# Patient Record
Sex: Female | Born: 2000 | Hispanic: No | Marital: Single
Health system: Southern US, Community
[De-identification: ages and names within clinical notes are randomized; demographics above are authoritative.]

---
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
## 2021-04-26 ENCOUNTER — Encounter (HOSPITAL_COMMUNITY): Payer: Self-pay | Admitting: Pharmacy Technician

## 2021-04-26 ENCOUNTER — Emergency Department (HOSPITAL_COMMUNITY): Payer: Managed Care, Other (non HMO)

## 2021-04-26 ENCOUNTER — Emergency Department (HOSPITAL_COMMUNITY)
Admission: EM | Admit: 2021-04-26 | Discharge: 2021-04-27 | Disposition: A | Discharge status: Home / Self Care | Payer: Managed Care, Other (non HMO) | Attending: Emergency Medicine | Admitting: Emergency Medicine

## 2021-04-26 DIAGNOSIS — M545 Low back pain, unspecified: Secondary | ICD-10-CM | POA: Insufficient documentation

## 2021-04-26 DIAGNOSIS — N3001 Acute cystitis with hematuria: Secondary | ICD-10-CM | POA: Insufficient documentation

## 2021-04-26 DIAGNOSIS — Y92410 Unspecified street and highway as the place of occurrence of the external cause: Secondary | ICD-10-CM | POA: Insufficient documentation

## 2021-04-26 DIAGNOSIS — M62830 Muscle spasm of back: Secondary | ICD-10-CM | POA: Diagnosis not present

## 2021-04-26 LAB — URINALYSIS, ROUTINE W REFLEX MICROSCOPIC
Bilirubin Urine: NEGATIVE
Glucose, UA: NEGATIVE mg/dL
Ketones, ur: 80 mg/dL — AB
Nitrite: POSITIVE — AB
Protein, ur: 30 mg/dL — AB
RBC / HPF: >50 RBC/hpf — ABNORMAL HIGH (ref 0–5)
Specific Gravity, Urine: 1.029 (ref 1.005–1.030)
pH: 5 (ref 5.0–8.0)

## 2021-04-26 LAB — POC URINE PREG, ED: Preg Test, Ur: NEGATIVE

## 2021-04-26 MED ORDER — ACETAMINOPHEN 500 MG PO TABS
1000.0000 mg | ORAL_TABLET | Freq: Once | ORAL | Status: AC
  Administered 2021-04-26: 1000 mg via ORAL
  Filled 2021-04-26: qty 2

## 2021-04-26 MED ORDER — KETOROLAC TROMETHAMINE 15 MG/ML IJ SOLN
15.0000 mg | Freq: Once | INTRAMUSCULAR | Status: AC
  Administered 2021-04-26: 15 mg via INTRAMUSCULAR
  Filled 2021-04-26: qty 1

## 2021-04-26 MED ORDER — CEPHALEXIN 250 MG PO CAPS: 250.0000 mg | ORAL_CAPSULE | Freq: Four times a day (QID) | ORAL | 0 refills | Status: AC

## 2021-04-26 MED ORDER — CYCLOBENZAPRINE HCL 10 MG PO TABS
5.0000 mg | ORAL_TABLET | Freq: Once | ORAL | Status: AC
  Administered 2021-04-26: 5 mg via ORAL
  Filled 2021-04-26: qty 1

## 2021-04-26 MED ORDER — LIDOCAINE 5 % EX PTCH
1.0000 | MEDICATED_PATCH | CUTANEOUS | Status: DC
  Administered 2021-04-26: 1 via TRANSDERMAL
  Filled 2021-04-26: qty 1
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
## 2021-04-26 NOTE — Discharge Instructions (Addendum)
For muscle pains and aches I recommend that you take Tylenol and Motrin as needed for the next 3-5 days.  You do have a urinary tract infection.  I have prescribed you an antibiotic.  Please take it as directed.
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
## 2022-03-09 ENCOUNTER — Other Ambulatory Visit: Payer: Self-pay

## 2022-03-09 ENCOUNTER — Emergency Department (HOSPITAL_COMMUNITY): Payer: Managed Care, Other (non HMO)

## 2022-03-09 ENCOUNTER — Emergency Department (HOSPITAL_COMMUNITY)
Admission: EM | Admit: 2022-03-09 | Discharge: 2022-03-09 | Disposition: A | Discharge status: Home / Self Care | Payer: Managed Care, Other (non HMO) | Attending: Emergency Medicine | Admitting: Emergency Medicine

## 2022-03-09 ENCOUNTER — Encounter (HOSPITAL_COMMUNITY): Payer: Self-pay | Admitting: Emergency Medicine

## 2022-03-09 DIAGNOSIS — K5900 Constipation, unspecified: Secondary | ICD-10-CM | POA: Insufficient documentation

## 2022-03-09 DIAGNOSIS — R197 Diarrhea, unspecified: Secondary | ICD-10-CM

## 2022-03-09 DIAGNOSIS — N3000 Acute cystitis without hematuria: Secondary | ICD-10-CM | POA: Diagnosis not present

## 2022-03-09 DIAGNOSIS — D72829 Elevated white blood cell count, unspecified: Secondary | ICD-10-CM | POA: Diagnosis not present

## 2022-03-09 DIAGNOSIS — K529 Noninfective gastroenteritis and colitis, unspecified: Secondary | ICD-10-CM | POA: Insufficient documentation

## 2022-03-09 DIAGNOSIS — R1084 Generalized abdominal pain: Secondary | ICD-10-CM | POA: Diagnosis present

## 2022-03-09 LAB — COMPREHENSIVE METABOLIC PANEL
ALT: 12 U/L (ref 0–44)
AST: 16 U/L (ref 15–41)
Albumin: 3.9 g/dL (ref 3.5–5.0)
Alkaline Phosphatase: 67 U/L (ref 38–126)
Anion gap: 9 (ref 5–15)
BUN: 7 mg/dL (ref 6–20)
CO2: 22 mmol/L (ref 22–32)
Calcium: 9.3 mg/dL (ref 8.9–10.3)
Chloride: 106 mmol/L (ref 98–111)
Creatinine, Ser: 0.73 mg/dL (ref 0.44–1.00)
GFR, Estimated: >60 mL/min (ref >60)
Glucose, Bld: 95 mg/dL (ref 70–99)
Potassium: 3.9 mmol/L (ref 3.5–5.1)
Sodium: 137 mmol/L (ref 135–145)
Total Bilirubin: 0.9 mg/dL (ref 0.3–1.2)
Total Protein: 7.5 g/dL (ref 6.5–8.1)

## 2022-03-09 LAB — CBC
HCT: 39.6 % (ref 36.0–46.0)
Hemoglobin: 12.7 g/dL (ref 12.0–15.0)
MCH: 25.7 pg — ABNORMAL LOW (ref 26.0–34.0)
MCHC: 32.1 g/dL (ref 30.0–36.0)
MCV: 80 fL (ref 80.0–100.0)
Platelets: 396 10*3/uL (ref 150–400)
RBC: 4.95 MIL/uL (ref 3.87–5.11)
RDW: 14.7 % (ref 11.5–15.5)
WBC: 12.3 10*3/uL — ABNORMAL HIGH (ref 4.0–10.5)
nRBC: 0 % (ref 0.0–0.2)

## 2022-03-09 LAB — URINALYSIS, ROUTINE W REFLEX MICROSCOPIC
Bacteria, UA: NONE SEEN
Bilirubin Urine: NEGATIVE
Glucose, UA: NEGATIVE mg/dL
Hgb urine dipstick: NEGATIVE
Ketones, ur: NEGATIVE mg/dL
Leukocytes,Ua: NEGATIVE
Nitrite: POSITIVE — AB
Protein, ur: 30 mg/dL — AB
Specific Gravity, Urine: 1.023 (ref 1.005–1.030)
pH: 5 (ref 5.0–8.0)

## 2022-03-09 LAB — I-STAT BETA HCG BLOOD, ED (MC, WL, AP ONLY): I-stat hCG, quantitative: <5 m[IU]/mL (ref <5)

## 2022-03-09 MED ORDER — SODIUM CHLORIDE 0.9 % IV BOLUS
1000.0000 mL | Freq: Once | INTRAVENOUS | Status: AC
  Administered 2022-03-09: 1000 mL via INTRAVENOUS

## 2022-03-09 MED ORDER — IOHEXOL 350 MG/ML SOLN
100.0000 mL | Freq: Once | INTRAVENOUS | Status: AC | PRN
  Administered 2022-03-09: 100 mL via INTRAVENOUS

## 2022-03-09 MED ORDER — CEPHALEXIN 500 MG PO CAPS: 500.0000 mg | ORAL_CAPSULE | Freq: Four times a day (QID) | ORAL | 0 refills | Status: AC

## 2022-03-09 NOTE — ED Triage Notes (Signed)
Pt states that she has not had BM in 1 week, took castor oil to help with this, subsequently experienced copious amounts of watery diarrhea and lower L and R abd pain.  ?She is concerned because she started feeling weak and dizzy for 15 min after.  ?Denies dizziness currently, denies blood in stool, syncope, previous episodes. ?Denies abd surgical hx.    ? ?

## 2022-03-09 NOTE — ED Provider Notes (Addendum)
?[facility] ?Provider Note ? ? ?CSN: [id number] ?Arrival date & time: 03/09/22  Katherine [id number] ? ?  ? ?History ? ?No chief complaint on file. ? ? ?Katherine Farmer is a 21 y.o. female. ? ?HPI ?Patient is a 21 year old female with no pertinent medical history who presents to the emergency [facility] due to constipation as well as abdominal pain.  Patient states that her symptoms started about 1 week ago.  Earlier today due to her constipation she took castor oil and began experiencing worsening abdominal pain as well as watery brown diarrhea.  Denies any hematochezia, nausea, vomiting, dysuria, vaginal discharge.  She states that she began feeling weak and lightheaded after the bowel movement.  No syncope. ?  ? ?Home Medications ?Prior to Admission medications   ?Medication Sig Start Date End Date Taking? Authorizing Provider  ?cephALEXin (KEFLEX) 500 MG capsule Take 1 capsule (500 mg total) by mouth 4 (four) times daily for 7 days. 03/09/22 03/16/22 Yes Placido SouJoldersma, Emet Rafanan, PA-C  ?   ? ?Allergies    ?Patient has no known allergies.   ? ?Review of Systems   ?Review of Systems  ?All other systems reviewed and are negative. ?Ten systems reviewed and are negative for acute change, except as noted in the HPI.   ?Physical Exam ?Updated Vital Signs ?BP 117/69   Pulse 92   Temp 98.9 ?F (37.2 ?C) (Oral)   Resp 17   Ht 5\' 3"  (1.6 m)   Wt 65.8 kg   LMP 02/24/2022 (Exact Date)   SpO2 100%   BMI 25.69 kg/m?  ?Physical Exam ?Vitals and nursing note reviewed.  ?Constitutional:   ?   General: She is not in acute distress. ?   Appearance: Normal appearance. She is not ill-appearing, toxic-appearing or diaphoretic.  ?HENT:  ?   Head: Normocephalic and atraumatic.  ?   Right Ear: External ear normal.  ?   Left Ear: External ear normal.  ?   Nose: Nose normal.  ?   Mouth/Throat:  ?   Mouth: Mucous membranes are moist.  ?   Pharynx: Oropharynx is clear. No oropharyngeal exudate or posterior oropharyngeal  erythema.  ?Eyes:  ?   General: No scleral icterus.    ?   Right eye: No discharge.     ?   Left eye: No discharge.  ?   Extraocular Movements: Extraocular movements intact.  ?   Conjunctiva/sclera: Conjunctivae normal.  ?Cardiovascular:  ?   Rate and Rhythm: Normal rate and regular rhythm.  ?   Pulses: Normal pulses.  ?   Heart sounds: Normal heart sounds. No murmur heard. ?  No friction rub. No gallop.  ?Pulmonary:  ?   Effort: Pulmonary effort is normal. No respiratory distress.  ?   Breath sounds: Normal breath sounds. No stridor. No wheezing, rhonchi or rales.  ?Abdominal:  ?   General: Abdomen is flat.  ?   Palpations: Abdomen is soft.  ?   Tenderness: There is abdominal tenderness.  ?   Comments: Abdomen is flat and soft.  Mild TTP noted diffusely along the periumbilical region as well as the lower abdomen.  ?Musculoskeletal:     ?   General: Normal range of motion.  ?   Cervical back: Normal range of motion and neck supple. No tenderness.  ?Skin: ?   General: Skin is warm and dry.  ?Neurological:  ?   General: No focal deficit present.  ?   Mental Status: She is  alert and oriented to person, place, and time.  ?Psychiatric:     ?   Mood and Affect: Mood normal.     ?   Behavior: Behavior normal.  ? ?ED Results / Procedures / Treatments   ?Labs ?(all labs ordered are listed, but only abnormal results are displayed) ?Labs Reviewed  ?CBC - Abnormal; Notable for the following components:  ?    Result Value  ? WBC 12.3 (*)   ? MCH 25.7 (*)   ? All other components within normal limits  ?URINALYSIS, ROUTINE W REFLEX MICROSCOPIC - Abnormal; Notable for the following components:  ? APPearance HAZY (*)   ? Protein, ur 30 (*)   ? Nitrite POSITIVE (*)   ? All other components within normal limits  ?URINE CULTURE  ?COMPREHENSIVE METABOLIC PANEL  ?I-STAT BETA HCG BLOOD, ED ([facility], [facility], [facility] ONLY)  ? ?EKG ?None ? ?Radiology ?CT ABDOMEN PELVIS W CONTRAST ? ?Result Date: 03/09/2022 ?CLINICAL DATA:  Diffuse abdominal pain and  constipation. EXAM: CT ABDOMEN AND PELVIS WITH CONTRAST TECHNIQUE: Multidetector CT imaging of the abdomen and pelvis was performed using the standard protocol following bolus administration of intravenous contrast. RADIATION DOSE REDUCTION: This exam was performed according to the departmental dose-optimization program which includes automated exposure control, adjustment of the mA and/or kV according to patient size and/or use of iterative reconstruction technique. CONTRAST:  OMNIPAQUE IOHEXOL 350 MG/ML SOLN COMPARISON:  None Available. FINDINGS: Lower chest: No acute abnormality. Hepatobiliary: No focal liver abnormality is seen. No gallstones, gallbladder wall thickening, or biliary dilatation. Pancreas: Unremarkable. No pancreatic ductal dilatation or surrounding inflammatory changes. Spleen: Normal in size without focal abnormality. Adrenals/Urinary Tract: Adrenal glands are unremarkable. Kidneys are normal, without renal calculi, focal lesion, or hydronephrosis. Bladder is unremarkable. Stomach/Bowel: Stomach is within normal limits. The appendix is not clearly identified. A minimal stool burden is noted. No evidence of bowel dilatation. Mild thickening of poorly distended loops of ascending and transverse colon are seen. Vascular/Lymphatic: No significant vascular findings are present. No enlarged abdominal or pelvic lymph nodes. Reproductive: Uterus and bilateral adnexa are unremarkable. Other: No abdominal wall hernia or abnormality. No abdominopelvic ascites. Musculoskeletal: No acute or significant osseous findings. IMPRESSION: Findings which may represent mild colitis involving the ascending and transverse colon. Electronically Signed   By: Aram Candela M.D.   On: 03/09/2022 22:12   ? ?Procedures ?Procedures  ? ?Medications Ordered in ED ?Medications  ?sodium chloride 0.9 % bolus 1,000 mL (1,000 mLs Intravenous New Bag/Given 03/09/22 2121)  ?iohexol (OMNIPAQUE) 350 MG/ML injection 100 mL (100  mLs Intravenous Contrast Given 03/09/22 2202)  ? ? ?ED Course/ Medical Decision Making/ A&P ?  ?                        ?Medical Decision Making ?Amount and/or Complexity of Data Reviewed ?Labs: ordered. ?Radiology: ordered. ? ?Risk ?Prescription drug management. ? ?Pt is a 21 y.o. female who presents to the emergency [facility] due to abdominal pain as well as constipation.  She took castor oil for constipation earlier today resulting in watery diarrhea as well as lightheadedness as well as a worsening of her abdominal pain. ? ?Labs: ?CBC with leukocytosis of 12.3 and an MCH of 25.7. ?CMP without abnormalities. ?UA with 30 protein 11-20 white blood cells, and nitrite positive. ?I-STAT beta-hCG less than 5. ?Urine culture sent. ? ?Imaging: ?CT scan of the abdomen/pelvis show "findings which may represent mild colitis involving the ascending and  transverse colon." ? ?I, Placido Sou, PA-C, personally reviewed and evaluated these images and lab results as part of my medical decision-making. ? ?On my initial exam heart is regular rate and rhythm without murmurs, rubs, or gallops.  Lungs are clear to auscultation bilaterally.  Patient afebrile, nontachycardic, and nontoxic-appearing.  Abdomen is flat and soft with mild tenderness noted along the periumbilical region and diffusely across the lower abdomen.  Given her leukocytosis, diarrhea, and abdominal pain I obtained a CT scan of the abdomen/pelvis with contrast with findings as noted above.  Concerning for possible mild colitis.  Patient denies any known hematochezia.  No persistent diarrhea.  Does not appear to be infectious.  Possibly secondary to her castor oil use?  Remaining lab work appears generally reassuring.  CMP shows normal kidney function and no electrolyte derangements.  UA is nitrite positive and does show 11-20 white blood cells.  Denies any urinary complaints or vaginal discharge.  Given that she is nitrite positive will discharge on a course of  Keflex.  Given the patient has no focal tenderness in the lower abdomen and denies any GU complaints, doubt PID or torsion at this time. ? ?Patient treated with 1 L of IV fluids.  She is lying comfortably in bed.  Appears sta

## 2022-03-09 NOTE — Discharge Instructions (Addendum)
Like we discussed, your urine was concerning for a urinary tract infection.  I am going to prescribe you an antibiotic called Keflex.  Please take this 4 times per day for the next 7 days.  Do not stop taking this antibiotic early.  Please take with a small amount of food to help prevent stomach irritation. ? ?Please continue to monitor your symptoms closely.  If you develop any new or worsening symptoms whatsoever please come back to the emergency department for reevaluation. ?

## 2022-03-12 LAB — URINE CULTURE: Culture: >=100000 — AB

## 2022-03-13 ENCOUNTER — Telehealth: Payer: Self-pay

## 2022-03-13 NOTE — Telephone Encounter (Signed)
Post ED Visit - Positive Culture Follow-up ? ?Culture report reviewed by antimicrobial stewardship pharmacist: ?[facility] ?[x]  , Sula Soda.D. ?[]  [place], Pharm.D., BCPS AQ-ID ?[]  , Pharm.D., BCPS ?[]  Celedonio Miyamoto, Pharm.D., BCPS ?[]  [place], Garvin Fila.D., BCPS, AAHIVP ?[]  , Pharm.D., BCPS, AAHIVP ?[]  Georgina Pillion, PharmD, BCPS ?[]  , PharmD, BCPS ?[]  [place], PharmD, BCPS ?[]  [address], PharmD ?[]  , PharmD, BCPS ?[]  Estella Husk, PharmD ? ? [facility] ?[]  Lysle Pearl, PharmD ?[]  , PharmD ?[]  Phillips Climes, PharmD ?[]  , Rph ?[]  Agapito Games) , PharmD ?[]  Verlan Friends, PharmD ?[]  , PharmD ?[]  Mervyn Gay, PharmD ?[]  , PharmD ?[]  Vinnie Level, PharmD ?[]  Gerri Spore, PharmD ?[]  , PharmD ?[]  Len Childs, PharmD ? ? ?Positive urine culture ?Treated with Cephalexin , organism sensitive to the same and no further patient follow-up is required at this time. ? ? ?03/13/2022, 11:07 AM ?  ?

## 2023-08-17 IMAGING — CT CT ABD-PELV W/ CM
2 of 4 series · 16 of 46 positions shown, 18 images · IV contrast (agent unspecified)
Comparison: None Available.

CLINICAL DATA: Diffuse abdominal pain and constipation.

EXAM:
CT ABDOMEN AND PELVIS WITH CONTRAST
TECHNIQUE: Multidetector CT imaging of the abdomen and pelvis was performed
using the standard protocol following bolus administration of
intravenous contrast.

[Series 3: abdomen 5.0 · axial · 0.83mm/px · z∈[+837,+1262]mm · 13 of 95 slices shown, 15 images]
[im 5/95  soft-tissue]
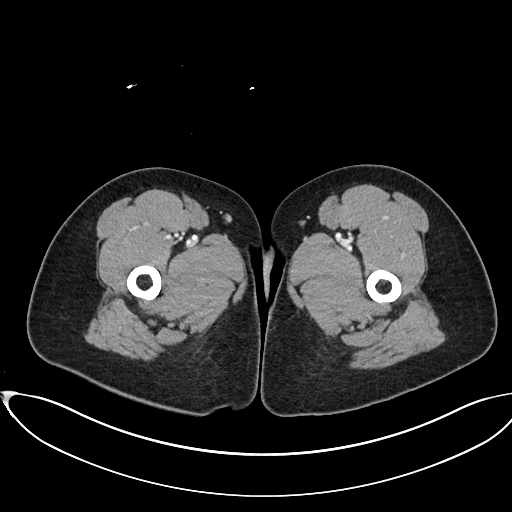
[im 5/95  bone]
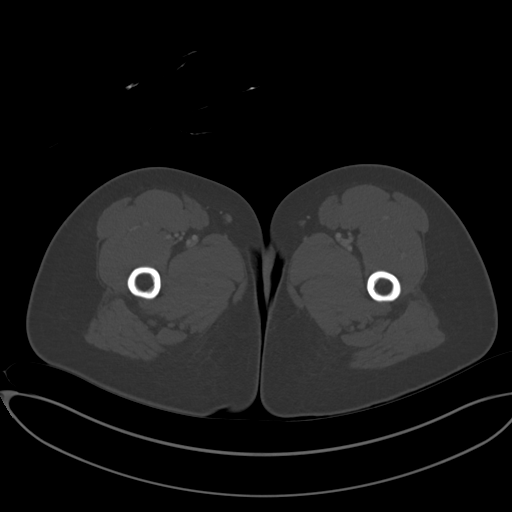
[im 15/95  soft-tissue]
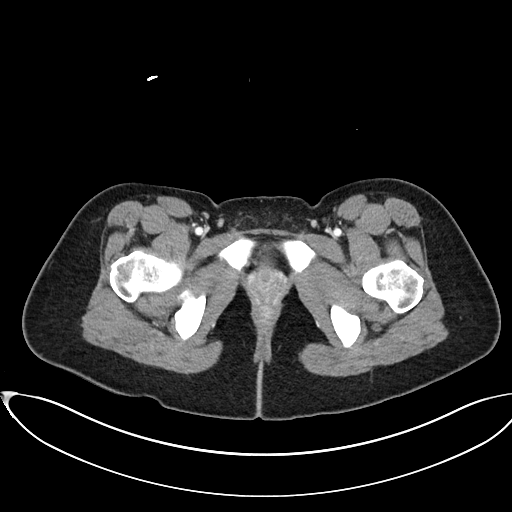
[im 20/95  soft-tissue]
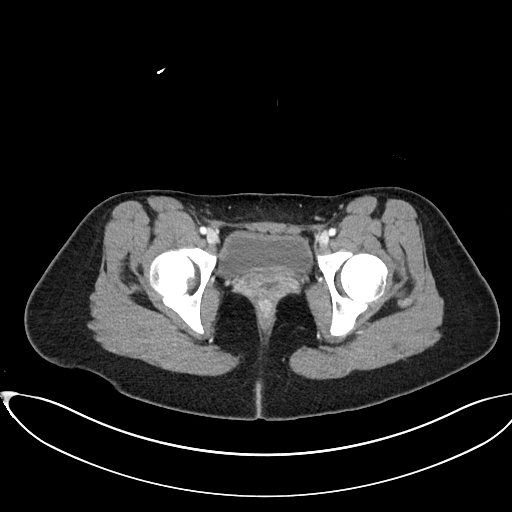
[im 25/95  soft-tissue]
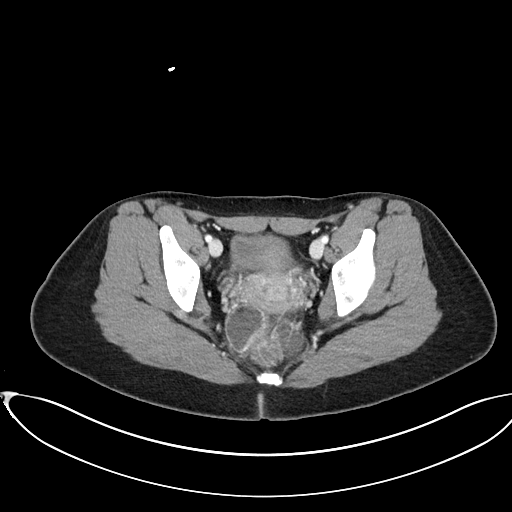
[im 35/95  soft-tissue]
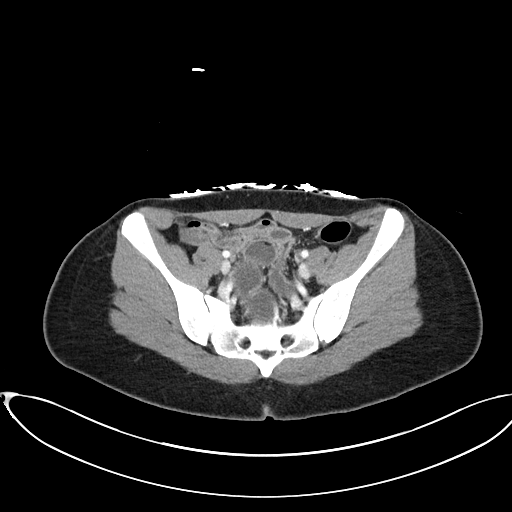
[im 40/95  soft-tissue]
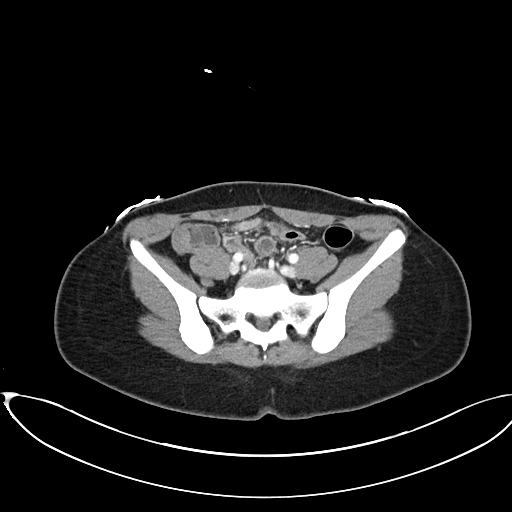
[im 50/95  soft-tissue]
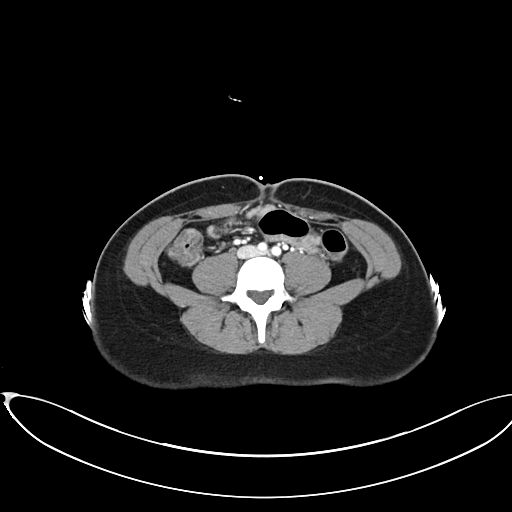
[im 55/95  soft-tissue]
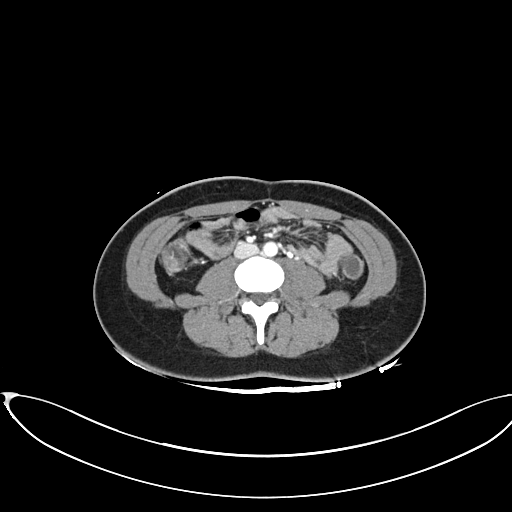
[im 60/95  soft-tissue]
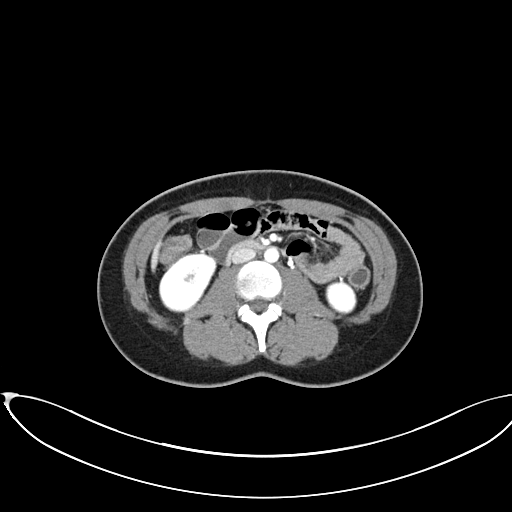
[im 60/95  bone]
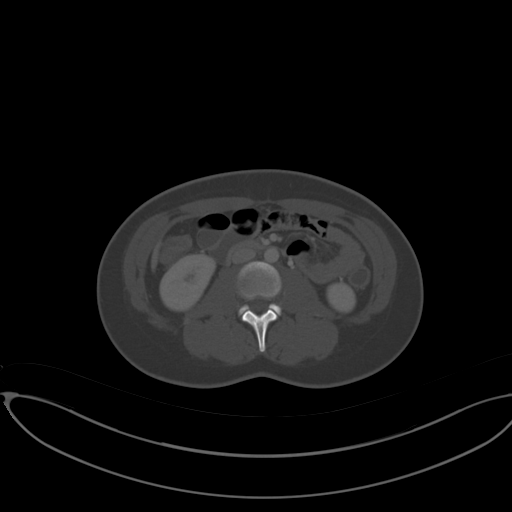
[im 70/95  soft-tissue]
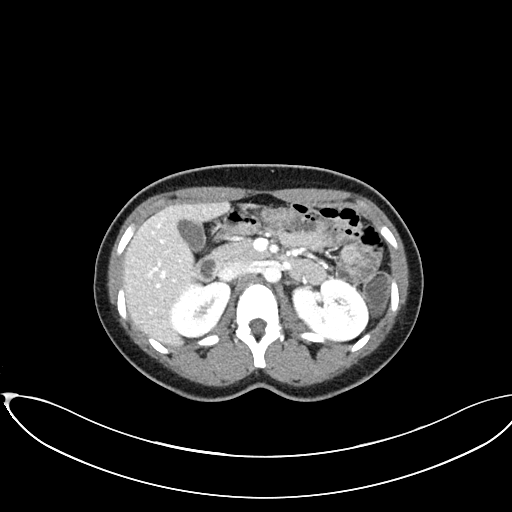
[im 75/95  soft-tissue]
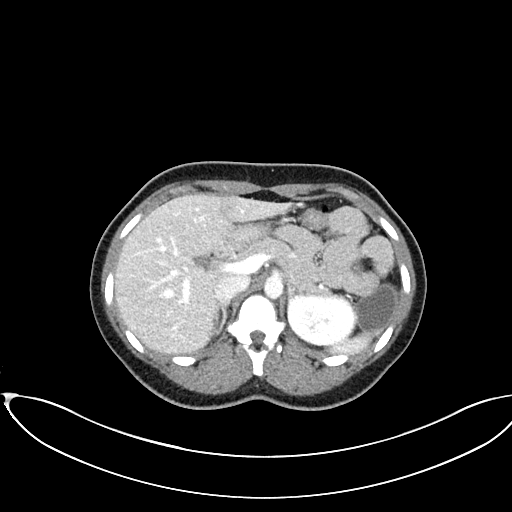
[im 80/95  soft-tissue]
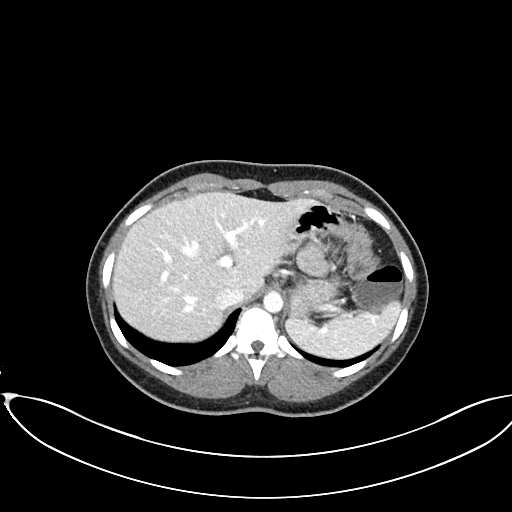
[im 90/95  soft-tissue]
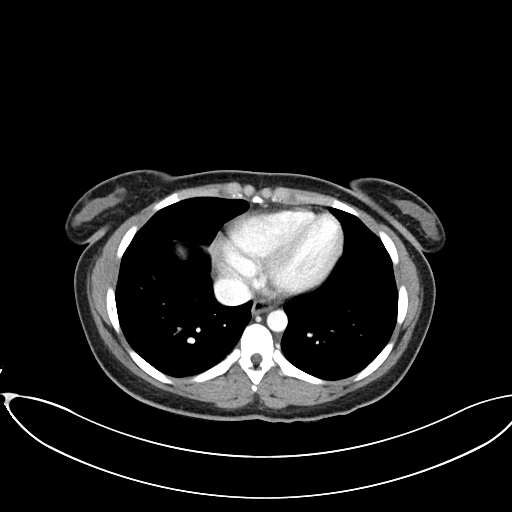

[Series 6: abdomen 3.0 mpr cor · coronal · 0.66mm/px · 3 of 72 slices shown]
[im 24/72  soft-tissue]
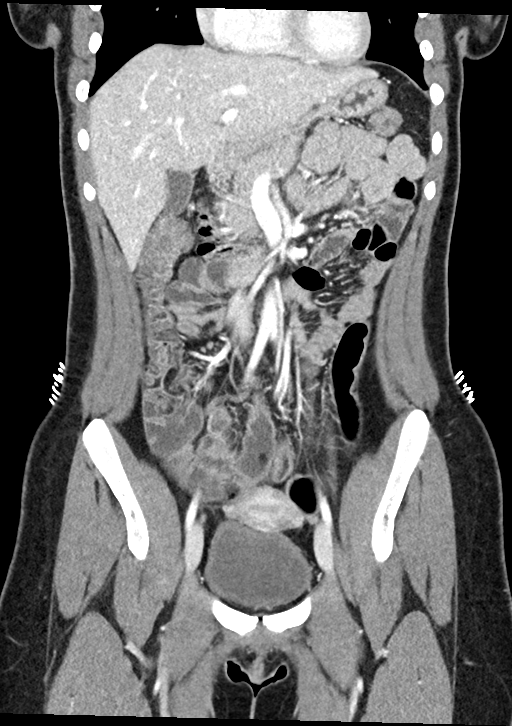
[im 32/72  soft-tissue]
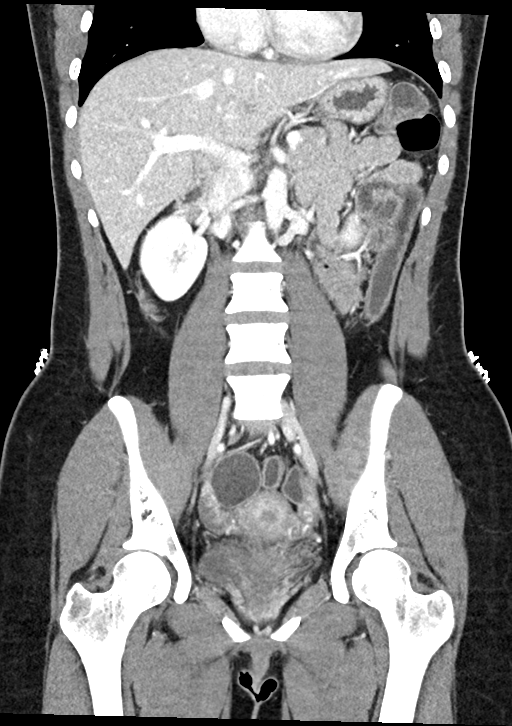
[im 40/72  soft-tissue]
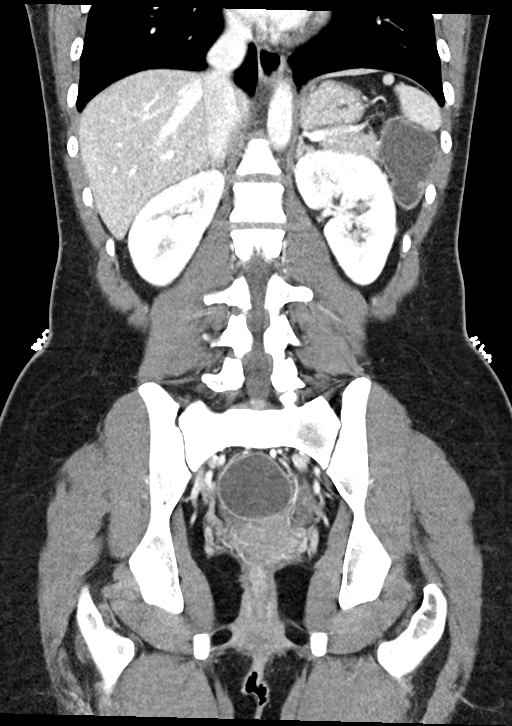

[16 of 46 positions shown; findings below may reference images not displayed]

RADIATION DOSE REDUCTION: This exam was performed according to the
departmental dose-optimization program which includes automated
exposure control, adjustment of the mA and/or kV according to
patient size and/or use of iterative reconstruction technique.

CONTRAST:  100mL OMNIPAQUE IOHEXOL 350 MG/ML SOLN
FINDINGS: Lower chest: No acute abnormality.

Hepatobiliary: No focal liver abnormality is seen. No gallstones,
gallbladder wall thickening, or biliary dilatation.

Pancreas: Unremarkable. No pancreatic ductal dilatation or
surrounding inflammatory changes.

Spleen: Normal in size without focal abnormality.

Adrenals/Urinary Tract: Adrenal glands are unremarkable. Kidneys are
normal, without renal calculi, focal lesion, or hydronephrosis.
Bladder is unremarkable.

Stomach/Bowel: Stomach is within normal limits. The appendix is not
clearly identified. A minimal stool burden is noted. No evidence of
bowel dilatation. Mild thickening of poorly distended loops of
ascending and transverse colon are seen.

Vascular/Lymphatic: No significant vascular findings are present. No
enlarged abdominal or pelvic lymph nodes.

Reproductive: Uterus and bilateral adnexa are unremarkable.

Other: No abdominal wall hernia or abnormality. No abdominopelvic
ascites.

Musculoskeletal: No acute or significant osseous findings.
IMPRESSION: Findings which may represent mild colitis involving the ascending
and transverse colon.
